# Patient Record
Sex: Female | Born: 1965 | Race: White | Hispanic: No | Smoking: Never smoker
Health system: Southern US, Community
[De-identification: ages and names within clinical notes are randomized; demographics above are authoritative.]

## PROBLEM LIST (undated history)

## (undated) HISTORY — PX: TAYLOR BUNIONECTOMY: SHX2485

## (undated) HISTORY — PX: WISDOM TOOTH EXTRACTION: SHX21

---

## 2009-12-03 ENCOUNTER — Encounter: Admission: RE | Admit: 2009-12-03 | Discharge: 2009-12-03 | Payer: Self-pay | Admitting: Family Medicine

## 2010-07-03 ENCOUNTER — Encounter: Admission: RE | Admit: 2010-07-03 | Discharge: 2010-07-03 | Payer: Self-pay | Admitting: Family Medicine

## 2011-06-03 ENCOUNTER — Other Ambulatory Visit: Payer: Self-pay | Admitting: Family Medicine

## 2011-06-03 DIAGNOSIS — Z1231 Encounter for screening mammogram for malignant neoplasm of breast: Secondary | ICD-10-CM

## 2011-07-16 ENCOUNTER — Ambulatory Visit
Admission: RE | Admit: 2011-07-16 | Discharge: 2011-07-16 | Disposition: A | Discharge status: Home / Self Care | Payer: BC Managed Care – PPO | Source: Ambulatory Visit | Attending: Family Medicine | Admitting: Family Medicine

## 2011-07-16 ENCOUNTER — Ambulatory Visit: Payer: Self-pay

## 2011-07-16 DIAGNOSIS — Z1231 Encounter for screening mammogram for malignant neoplasm of breast: Secondary | ICD-10-CM

## 2012-07-04 ENCOUNTER — Other Ambulatory Visit: Payer: Self-pay | Admitting: Family Medicine

## 2012-07-04 DIAGNOSIS — Z1231 Encounter for screening mammogram for malignant neoplasm of breast: Secondary | ICD-10-CM

## 2012-08-11 ENCOUNTER — Ambulatory Visit
Admission: RE | Admit: 2012-08-11 | Discharge: 2012-08-11 | Disposition: A | Discharge status: Home / Self Care | Payer: BC Managed Care – PPO | Source: Ambulatory Visit | Attending: Family Medicine | Admitting: Family Medicine

## 2012-08-11 DIAGNOSIS — Z1231 Encounter for screening mammogram for malignant neoplasm of breast: Secondary | ICD-10-CM

## 2012-08-15 ENCOUNTER — Ambulatory Visit: Payer: BC Managed Care – PPO

## 2013-07-06 ENCOUNTER — Other Ambulatory Visit: Payer: Self-pay

## 2013-07-06 DIAGNOSIS — Z1231 Encounter for screening mammogram for malignant neoplasm of breast: Secondary | ICD-10-CM

## 2013-09-12 ENCOUNTER — Ambulatory Visit
Admission: RE | Admit: 2013-09-12 | Discharge: 2013-09-12 | Disposition: A | Discharge status: Home / Self Care | Payer: PRIVATE HEALTH INSURANCE | Source: Ambulatory Visit

## 2013-09-12 DIAGNOSIS — Z1231 Encounter for screening mammogram for malignant neoplasm of breast: Secondary | ICD-10-CM

## 2014-06-03 ENCOUNTER — Other Ambulatory Visit: Payer: Self-pay

## 2014-06-03 DIAGNOSIS — N6452 Nipple discharge: Secondary | ICD-10-CM

## 2014-06-04 ENCOUNTER — Other Ambulatory Visit: Payer: Self-pay | Admitting: Obstetrics and Gynecology

## 2014-06-04 DIAGNOSIS — N6452 Nipple discharge: Secondary | ICD-10-CM

## 2014-06-12 ENCOUNTER — Ambulatory Visit
Admission: RE | Admit: 2014-06-12 | Discharge: 2014-06-12 | Disposition: A | Discharge status: Home / Self Care | Payer: PRIVATE HEALTH INSURANCE | Source: Ambulatory Visit | Attending: Obstetrics and Gynecology | Admitting: Obstetrics and Gynecology

## 2014-06-12 ENCOUNTER — Other Ambulatory Visit: Payer: Self-pay | Admitting: Obstetrics and Gynecology

## 2014-06-12 DIAGNOSIS — N6452 Nipple discharge: Secondary | ICD-10-CM

## 2014-07-10 ENCOUNTER — Ambulatory Visit
Admission: RE | Admit: 2014-07-10 | Discharge: 2014-07-10 | Disposition: A | Discharge status: Home / Self Care | Payer: PRIVATE HEALTH INSURANCE | Source: Ambulatory Visit | Attending: Obstetrics and Gynecology | Admitting: Obstetrics and Gynecology

## 2014-07-10 DIAGNOSIS — N6452 Nipple discharge: Secondary | ICD-10-CM

## 2014-08-26 ENCOUNTER — Other Ambulatory Visit: Payer: Self-pay

## 2014-08-26 DIAGNOSIS — Z1231 Encounter for screening mammogram for malignant neoplasm of breast: Secondary | ICD-10-CM

## 2014-09-16 ENCOUNTER — Ambulatory Visit
Admission: RE | Admit: 2014-09-16 | Discharge: 2014-09-16 | Disposition: A | Discharge status: Home / Self Care | Payer: BLUE CROSS/BLUE SHIELD | Source: Ambulatory Visit

## 2014-09-16 DIAGNOSIS — Z1231 Encounter for screening mammogram for malignant neoplasm of breast: Secondary | ICD-10-CM

## 2015-08-15 ENCOUNTER — Other Ambulatory Visit: Payer: Self-pay

## 2015-08-15 DIAGNOSIS — Z1231 Encounter for screening mammogram for malignant neoplasm of breast: Secondary | ICD-10-CM

## 2015-10-22 ENCOUNTER — Ambulatory Visit
Admission: RE | Admit: 2015-10-22 | Discharge: 2015-10-22 | Disposition: A | Discharge status: Home / Self Care | Payer: BLUE CROSS/BLUE SHIELD | Source: Ambulatory Visit

## 2015-10-22 DIAGNOSIS — Z1231 Encounter for screening mammogram for malignant neoplasm of breast: Secondary | ICD-10-CM

## 2016-08-18 ENCOUNTER — Encounter: Payer: Self-pay | Admitting: Gastroenterology

## 2016-09-24 ENCOUNTER — Other Ambulatory Visit: Payer: Self-pay | Admitting: Family Medicine

## 2016-09-24 DIAGNOSIS — Z1231 Encounter for screening mammogram for malignant neoplasm of breast: Secondary | ICD-10-CM

## 2016-10-11 ENCOUNTER — Ambulatory Visit (AMBULATORY_SURGERY_CENTER): Payer: Self-pay | Admitting: *Deleted

## 2016-10-11 VITALS — Ht 66.0 in | Wt 166.0 lb

## 2016-10-11 DIAGNOSIS — Z1211 Encounter for screening for malignant neoplasm of colon: Secondary | ICD-10-CM

## 2016-10-11 MED ORDER — NA SULFATE-K SULFATE-MG SULF 17.5-3.13-1.6 GM/177ML PO SOLN: ORAL | 0 refills | Status: DC

## 2016-10-11 NOTE — Progress Notes (Signed)
Patient denies any allergies to eggs or soy. Patient denies any problems with anesthesia/sedation. Patient denies any oxygen use at home and does not take any diet/weight loss medications. EMMI education declined by patient.  

## 2016-10-12 ENCOUNTER — Encounter: Payer: Self-pay | Admitting: Gastroenterology

## 2016-10-25 ENCOUNTER — Encounter: Payer: Self-pay | Admitting: Gastroenterology

## 2016-10-25 ENCOUNTER — Ambulatory Visit (AMBULATORY_SURGERY_CENTER): Payer: BLUE CROSS/BLUE SHIELD | Admitting: Gastroenterology

## 2016-10-25 VITALS — BP 92/64 | HR 63 | Temp 96.2°F | Resp 13 | Ht 66.0 in | Wt 166.0 lb

## 2016-10-25 DIAGNOSIS — D120 Benign neoplasm of cecum: Secondary | ICD-10-CM

## 2016-10-25 DIAGNOSIS — Z1211 Encounter for screening for malignant neoplasm of colon: Secondary | ICD-10-CM | POA: Diagnosis not present

## 2016-10-25 DIAGNOSIS — Z1212 Encounter for screening for malignant neoplasm of rectum: Secondary | ICD-10-CM | POA: Diagnosis not present

## 2016-10-25 DIAGNOSIS — D126 Benign neoplasm of colon, unspecified: Secondary | ICD-10-CM | POA: Diagnosis not present

## 2016-10-25 MED ORDER — SODIUM CHLORIDE 0.9 % IV SOLN: 500.0000 mL | INTRAVENOUS | Status: AC

## 2016-10-25 NOTE — Patient Instructions (Signed)
Impression/Recommendations:  Polyp handout given to patient.  Repeat colonoscopy in 5 - 10 years based on pathology results.  YOU HAD AN ENDOSCOPIC PROCEDURE TODAY AT [facility]:   Refer to the procedure report that was given to you for any specific questions about what was found during the examination.  If the procedure report does not answer your questions, please call your gastroenterologist to clarify.  If you requested that your care partner not be given the details of your procedure findings, then the procedure report has been included in a sealed envelope for you to review at your convenience later.  YOU SHOULD EXPECT: Some feelings of bloating in the abdomen. Passage of more gas than usual.  Walking can help get rid of the air that was put into your GI tract during the procedure and reduce the bloating. If you had a lower endoscopy (such as a colonoscopy or flexible sigmoidoscopy) you may notice spotting of blood in your stool or on the toilet paper. If you underwent a bowel prep for your procedure, you may not have a normal bowel movement for a few days.  Please Note:  You might notice some irritation and congestion in your nose or some drainage.  This is from the oxygen used during your procedure.  There is no need for concern and it should clear up in a day or so.  SYMPTOMS TO REPORT IMMEDIATELY:   Following lower endoscopy (colonoscopy or flexible sigmoidoscopy):  Excessive amounts of blood in the stool  Significant tenderness or worsening of abdominal pains  Swelling of the abdomen that is new, acute  Fever of 100F or higherFor urgent or emergent issues, a gastroenterologist can be reached at any hour by calling [phone].   DIET:  We do recommend a small meal at first, but then you may proceed to your regular diet.  Drink plenty of fluids but you should avoid alcoholic beverages for 24 hours.  ACTIVITY:  You should plan to take it easy for the rest of  today and you should NOT DRIVE or use heavy machinery until tomorrow (because of the sedation medicines used during the test).    FOLLOW UP: Our staff will call the number listed on your records the next business day following your procedure to check on you and address any questions or concerns that you may have regarding the information given to you following your procedure. If we do not reach you, we will leave a message.  However, if you are feeling well and you are not experiencing any problems, there is no need to return our call.  We will assume that you have returned to your regular daily activities without incident.  If any biopsies were taken you will be contacted by phone or by letter within the next 1-3 weeks.  Please call us at [phone] if you have not heard about the biopsies in 3 weeks.    SIGNATURES/CONFIDENTIALITY: You and/or your care partner have signed paperwork which will be entered into your electronic medical record.  These signatures attest to the fact that that the information above on your After Visit Summary has been reviewed and is understood.  Full responsibility of the confidentiality of this discharge information lies with you and/or your care-partner.

## 2016-10-25 NOTE — Progress Notes (Signed)
Called to room to assist during endoscopic procedure.  Patient ID and intended procedure confirmed with present staff. Received instructions for my participation in the procedure from the performing physician.  

## 2016-10-25 NOTE — Progress Notes (Signed)
Report to PACU, RN, vss, BBS= Clear.  

## 2016-10-25 NOTE — Op Note (Signed)
[name] Patient Name: Katie Carroll Procedure Date: 10/25/2016 2:12 PM MRN: [id number] Endoscopist: Ladene Artist , MD Age: 51 Referring MD:  Date of Birth: [birth year] Gender: Female Account #: [id number] Procedure:                Colonoscopy Indications:              Screening for colorectal malignant neoplasm Medicines:                Monitored Anesthesia Care Procedure:                Pre-Anesthesia Assessment:                           - Prior to the procedure, a History and Physical                            was performed, and patient medications and                            allergies were reviewed. The patient's tolerance of                            previous anesthesia was also reviewed. The risks                            and benefits of the procedure and the sedation                            options and risks were discussed with the patient.                            All questions were answered, and informed consent                            was obtained. Prior Anticoagulants: The patient has                            taken no previous anticoagulant or antiplatelet                            agents. ASA Grade Assessment: II - A patient with                            mild systemic disease. After reviewing the risks                            and benefits, the patient was deemed in                            satisfactory condition to undergo the procedure.                           After obtaining informed consent, the colonoscope  was passed under direct vision. Throughout the                            procedure, the patient's blood pressure, pulse, and                            oxygen saturations were monitored continuously. The                            Model PCF-H190L [id number]) scope was introduced                            through the anus and advanced to the the cecum,                            identified  by appendiceal orifice and ileocecal                            valve. The ileocecal valve, appendiceal orifice,                            and rectum were photographed. The quality of the                            bowel preparation was good. The colonoscopy was                            performed without difficulty. The patient tolerated                            the procedure well. Scope In: 2:22:25 PM Scope Out: 2:35:21 PM Scope Withdrawal Time: 0 hours 10 minutes 1 second  Total Procedure Duration: 0 hours 12 minutes 56 seconds  Findings:                 The perianal and digital rectal examinations were                            normal.                           A 7 mm polyp was found in the cecum. The polyp was                            sessile. The polyp was removed with a cold snare.                            Resection and retrieval were complete.                           The exam was otherwise without abnormality on                            direct and retroflexion views. Complications:  No immediate complications. Estimated blood loss:                            None. Estimated Blood Loss:     Estimated blood loss: none. Impression:               - One 7 mm polyp in the cecum, removed with a cold                            snare. Resected and retrieved.                           - The examination was otherwise normal on direct                            and retroflexion views. Recommendation:           - Repeat colonoscopy in 5 years for surveillance if                            polyp is precancerous, otherwise 10 years for                            screening.                           - Patient has a contact number available for                            emergencies. The signs and symptoms of potential                            delayed complications were discussed with the                            patient. Return to normal activities tomorrow.                             Written discharge instructions were provided to the                            patient.                           - Resume previous diet.                           - Continue present medications.                           - Await pathology results. Ladene Artist, MD 10/25/2016 2:38:03 PM This report has been signed electronically.

## 2016-10-26 ENCOUNTER — Telehealth: Payer: Self-pay

## 2016-10-26 NOTE — Telephone Encounter (Signed)
  Follow up Call-  Call back number 10/25/2016  Post procedure Call Back phone  # [phone]  Permission to leave phone message Yes  Some recent data might be hidden     Patient questions:  Do you have a fever, pain , or abdominal swelling? No. Pain Score  0 *  Have you tolerated food without any problems? Yes.    Have you been able to return to your normal activities? Yes.    Do you have any questions about your discharge instructions: Diet   No. Medications  No. Follow up visit  No.  Do you have questions or concerns about your Care? No.  Actions: * If pain score is 4 or above: No action needed, pain <4.

## 2016-11-10 ENCOUNTER — Ambulatory Visit
Admission: RE | Admit: 2016-11-10 | Discharge: 2016-11-10 | Disposition: A | Discharge status: Home / Self Care | Payer: BLUE CROSS/BLUE SHIELD | Source: Ambulatory Visit | Attending: Family Medicine | Admitting: Family Medicine

## 2016-11-10 DIAGNOSIS — Z1231 Encounter for screening mammogram for malignant neoplasm of breast: Secondary | ICD-10-CM

## 2016-11-11 ENCOUNTER — Other Ambulatory Visit: Payer: Self-pay | Admitting: Family Medicine

## 2016-11-11 ENCOUNTER — Encounter: Payer: Self-pay | Admitting: Gastroenterology

## 2016-11-11 DIAGNOSIS — R928 Other abnormal and inconclusive findings on diagnostic imaging of breast: Secondary | ICD-10-CM

## 2016-11-15 ENCOUNTER — Encounter: Payer: Self-pay | Admitting: Family Medicine

## 2016-11-15 ENCOUNTER — Ambulatory Visit
Admission: RE | Admit: 2016-11-15 | Discharge: 2016-11-15 | Disposition: A | Discharge status: Home / Self Care | Payer: BLUE CROSS/BLUE SHIELD | Source: Ambulatory Visit | Attending: Family Medicine | Admitting: Family Medicine

## 2016-11-15 ENCOUNTER — Telehealth: Payer: Self-pay | Admitting: Gastroenterology

## 2016-11-15 DIAGNOSIS — R928 Other abnormal and inconclusive findings on diagnostic imaging of breast: Secondary | ICD-10-CM

## 2016-11-15 NOTE — Telephone Encounter (Signed)
Patient notified of the letter from last week and the details.  All questions answered. She will call back for any additional questions

## 2017-09-08 IMAGING — MG 2D DIGITAL DIAGNOSTIC UNILATERAL LEFT MAMMOGRAM WITH CAD AND ADJ
6 series · 6 of 14 positions shown · non-contrast
Comparison: October 22, 2015

CLINICAL DATA: Possible mass left breast identified on recent
screening mammogram.

EXAM:
2D DIGITAL DIAGNOSTIC LEFT MAMMOGRAM WITH ADJUNCT TOMO
ULTRASOUND LEFT BREAST

[L CC]
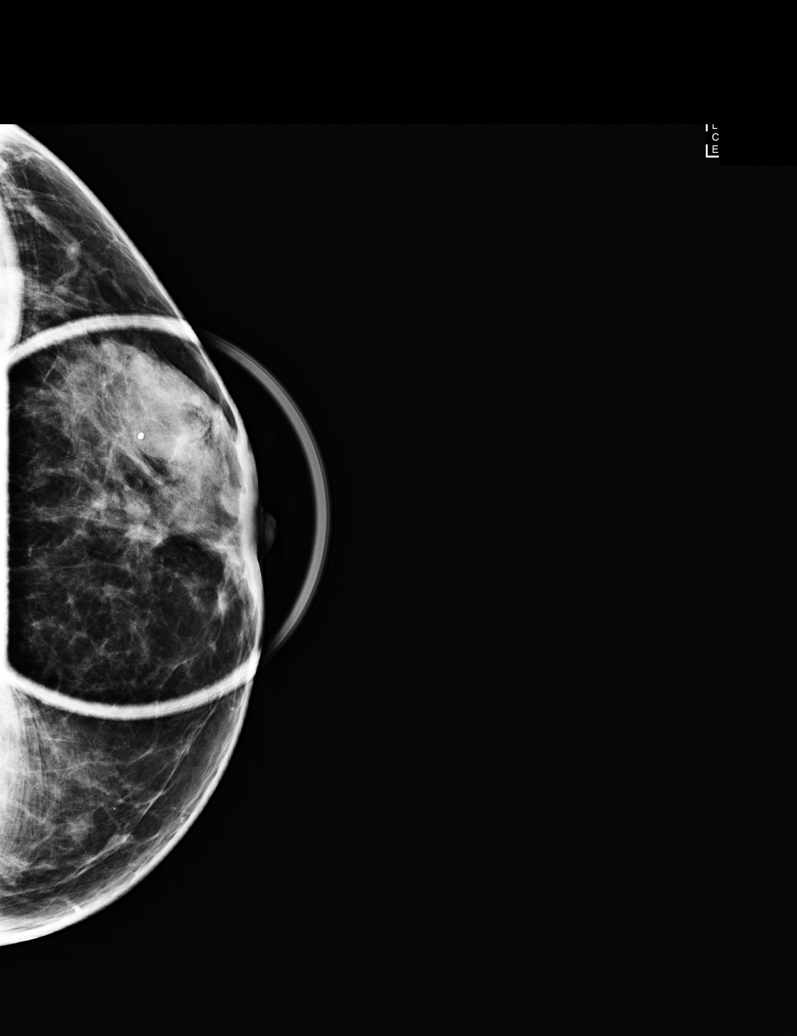

[L CC synth-2D]
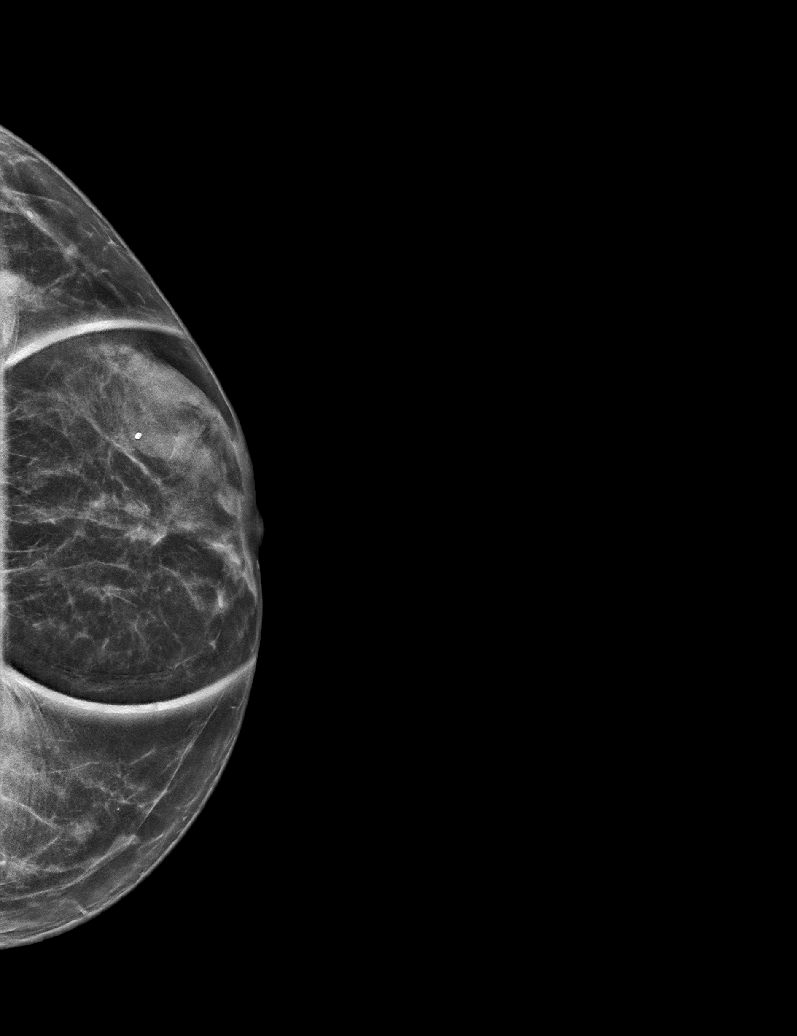

[L MLO]
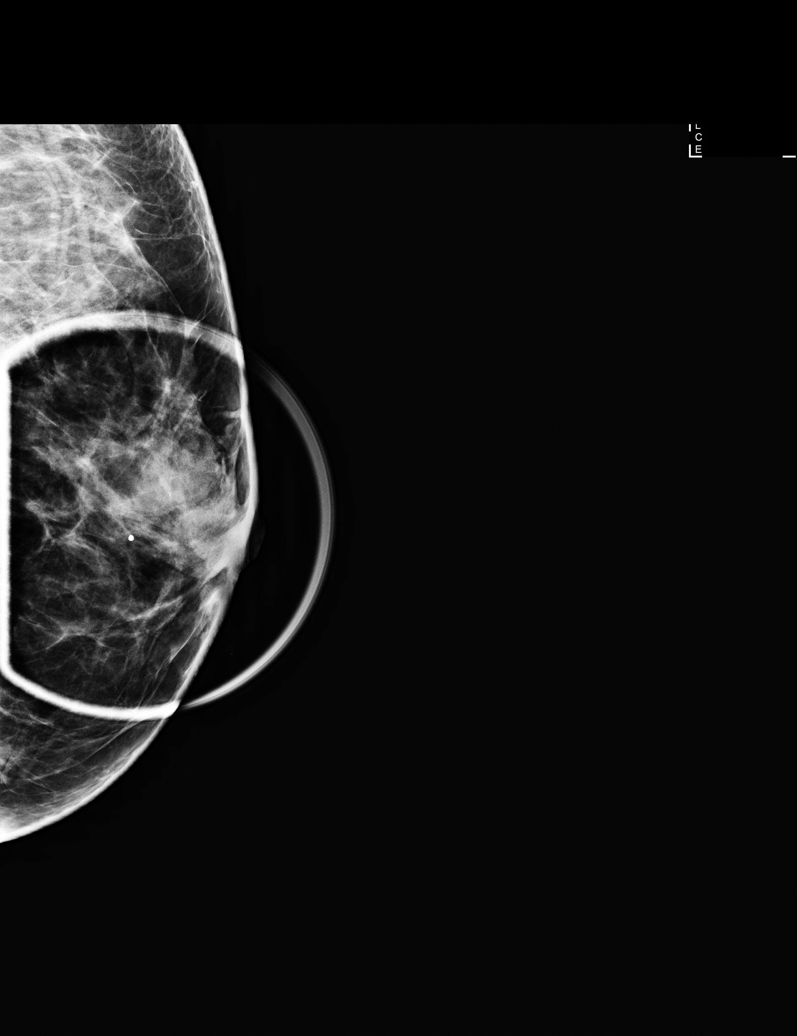

[L MLO synth-2D]
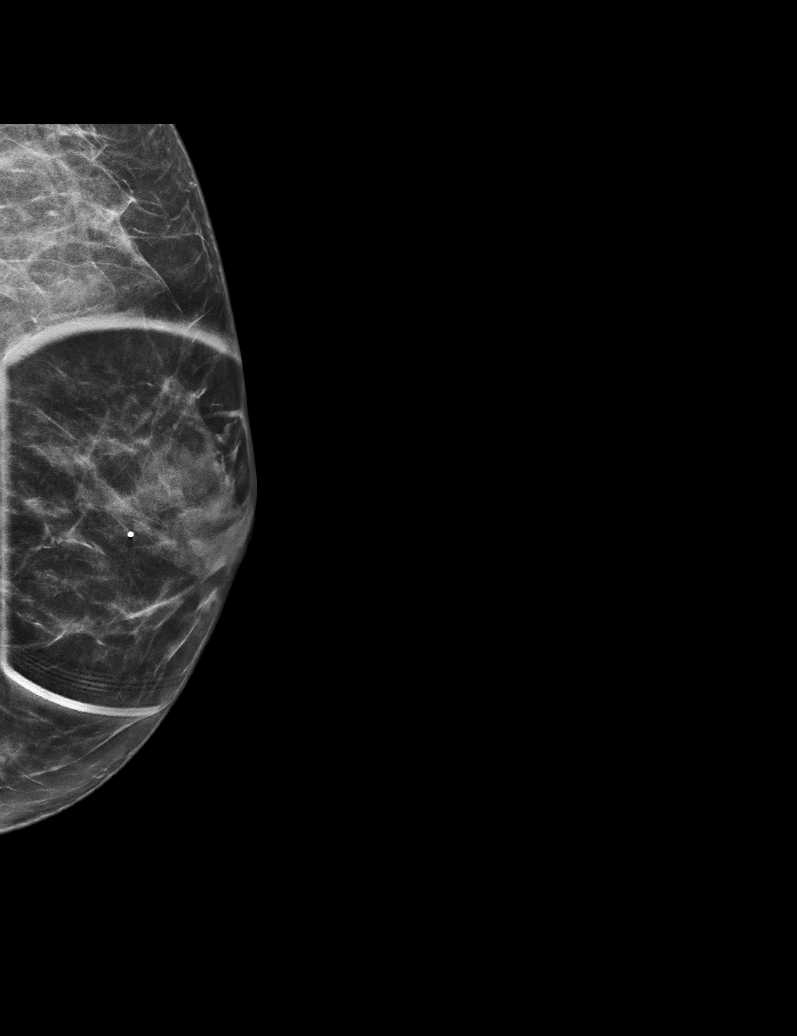

[L CC tomo · tomo slice 25/48.0]
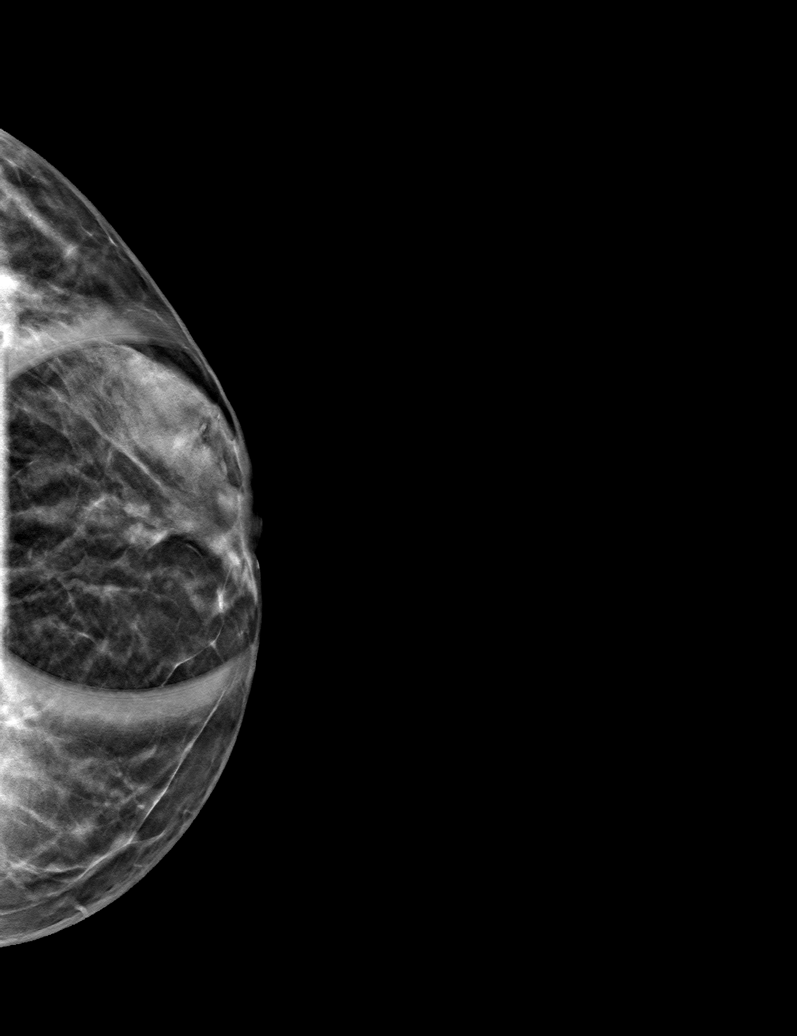

[L MLO tomo · tomo slice 27/54.0]
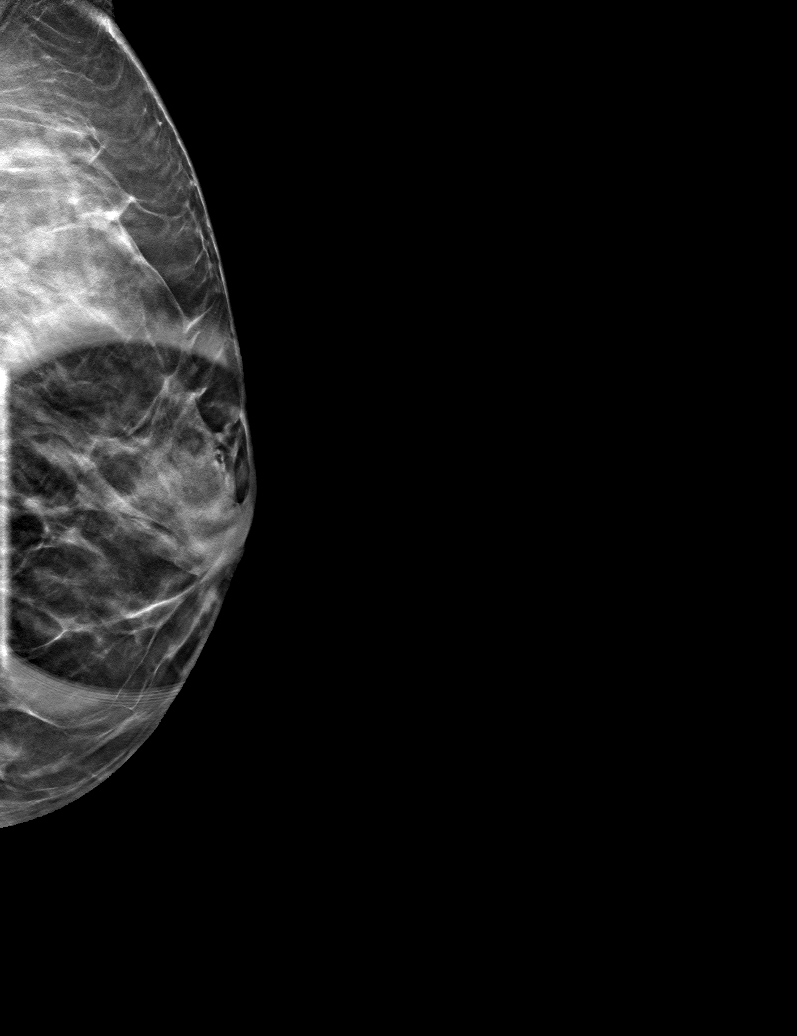

[6 of 14 positions shown; findings below may reference images not displayed]

ACR Breast Density Category c: The breast tissue is heterogeneously
dense, which may obscure small masses.
FINDINGS: Focal spot compression views of the retroareolar left breast confirm
a low-density circumscribed oval mass in the outer periareolar left
breast.

On physical exam, a smooth approximately 2.0 cm mass is palpated in
the 3 o'clock position left breast 2 cm from the nipple.

Targeted ultrasound is performed, showing an oval 1.8 x 1.4 x 1.2 cm
oval simple cyst is imaged at 3 o'clock position 2 cm from the
nipple. This accounts for the mass seen on recent screening
mammogram. No suspicious masses seen.
IMPRESSION: 1.8 cm simple cyst 3 o'clock position left breast. No evidence of
malignancy.

RECOMMENDATION:
Screening mammogram in one year.(Code:VG-5-LDH)

I have discussed the findings and recommendations with the patient.
Results were also provided in writing at the conclusion of the
visit. If applicable, a reminder letter will be sent to the patient
regarding the next appointment.

BI-RADS CATEGORY  2: Benign.

## 2017-10-10 ENCOUNTER — Other Ambulatory Visit: Payer: Self-pay | Admitting: Family Medicine

## 2017-10-10 DIAGNOSIS — Z1231 Encounter for screening mammogram for malignant neoplasm of breast: Secondary | ICD-10-CM

## 2017-11-14 ENCOUNTER — Ambulatory Visit
Admission: RE | Admit: 2017-11-14 | Discharge: 2017-11-14 | Disposition: A | Discharge status: Home / Self Care | Payer: PRIVATE HEALTH INSURANCE | Source: Ambulatory Visit | Attending: Family Medicine | Admitting: Family Medicine

## 2017-11-14 DIAGNOSIS — Z1231 Encounter for screening mammogram for malignant neoplasm of breast: Secondary | ICD-10-CM

## 2018-03-17 ENCOUNTER — Encounter: Payer: Self-pay | Admitting: Family Medicine

## 2018-03-17 ENCOUNTER — Ambulatory Visit (INDEPENDENT_AMBULATORY_CARE_PROVIDER_SITE_OTHER): Payer: PRIVATE HEALTH INSURANCE | Admitting: Family Medicine

## 2018-03-17 VITALS — BP 104/66 | HR 56 | Ht 66.0 in | Wt 145.0 lb

## 2018-03-17 DIAGNOSIS — G5702 Lesion of sciatic nerve, left lower limb: Secondary | ICD-10-CM

## 2018-03-17 DIAGNOSIS — M25552 Pain in left hip: Secondary | ICD-10-CM | POA: Diagnosis not present

## 2018-03-17 NOTE — Assessment & Plan Note (Signed)
2/2 piriformis syndrome.  Shown home exercises and stretches to start - she's going to do physical therapy.  Tylenol aleve if needed.  Massage.  Run on flat surfaces.  F/u in 6 weeks.

## 2018-03-17 NOTE — Patient Instructions (Signed)
You have piriformis syndrome. Try to avoid painful activities when possible. Pick 2-3 stretches where you feel the pull in the area of pain - do 3 of these and hold for 20-30 seconds twice a day. Tylenol, aleve only if needed. Lacrosse or tennis ball to massage area when sitting if needed. Start physical therapy at [facility] in [place] and do home exercises/stretches on days you don't go to therapy. Run on flat surfaces (treadmill, track) as you have been. Follow up with me in 6 weeks.

## 2018-03-17 NOTE — Progress Notes (Signed)
PCP: Yvone Neu, MD  Subjective:   HPI: Patient is a 52 y.o. female here for left hip pain.  Patient reports she's had about 8 months of posterior left hip pain. No acute injury or trauma. Pain is 2/10 but up to 5/10 with running, sharp. Can radiate down into her leg but no numbness, skin changes. Starts with running and bothers the whole time. Has tried some stretching which helps.  No past medical history on file.  Current Outpatient Medications on File Prior to Visit  Medication Sig Dispense Refill  . Ascorbic Acid (VITAMIN C) 1000 MG tablet Take 1,000 mg by mouth daily.    . Cholecalciferol (VITAMIN D PO) Take 5,000 Units by mouth daily.    . Lactobacillus (ACIDOPHOLUS PO) Take 1 capsule by mouth daily.    . mometasone (NASONEX) 50 MCG/ACT nasal spray Place 2 sprays into the nose daily.     Current Facility-Administered Medications on File Prior to Visit  Medication Dose Route Frequency Provider Last Rate Last Dose  . 0.9 %  sodium chloride infusion  500 mL Intravenous Continuous Ladene Artist, MD        Past Surgical History:  Procedure Laterality Date  . TAYLOR BUNIONECTOMY Bilateral   . WISDOM TOOTH EXTRACTION      Allergies  Allergen Reactions  . Sulfur Other (See Comments)    Inflammation   . Niacin And Related     Skin redness and "spacey"  . Proanthocyanidin Other (See Comments)    Red blisters  . Grape Seed Extract [Calcium Phosphate] Rash    Social History   Socioeconomic History  . Marital status: Legally Separated    Spouse name: Not on file  . Number of children: Not on file  . Years of education: Not on file  . Highest education level: Not on file  Occupational History  . Not on file  Social Needs  . Financial resource strain: Not on file  . Food insecurity:    Worry: Not on file    Inability: Not on file  . Transportation needs:    Medical: Not on file    Non-medical: Not on file  Tobacco Use  . Smoking status: Never  Smoker  . Smokeless tobacco: Never Used  Substance and Sexual Activity  . Alcohol use: Yes    Comment: occasional- monthly maybe  . Drug use: No  . Sexual activity: Not on file  Lifestyle  . Physical activity:    Days per week: Not on file    Minutes per session: Not on file  . Stress: Not on file  Relationships  . Social connections:    Talks on phone: Not on file    Gets together: Not on file    Attends religious service: Not on file    Active member of club or organization: Not on file    Attends meetings of clubs or organizations: Not on file    Relationship status: Not on file  . Intimate partner violence:    Fear of current or ex partner: Not on file    Emotionally abused: Not on file    Physically abused: Not on file    Forced sexual activity: Not on file  Other Topics Concern  . Not on file  Social History Narrative  . Not on file    Family History  Problem Relation Age of Onset  . Uterine cancer Mother   . Diabetes Father   . Colon cancer Neg Hx   .  Breast cancer Neg Hx     BP 104/66   Pulse (!) 56   Ht 5\' 6"  (1.676 m)   Wt 145 lb (65.8 kg)   BMI 23.40 kg/m   Review of Systems: See HPI above.     Objective:  Physical Exam:  Gen: NAD, comfortable in exam room  Back: No gross deformity, scoliosis. No TTP .  No midline or bony TTP. FROM. Strength LEs 5/5 all muscle groups.   2+ MSRs in patellar and achilles tendons, equal bilaterally. Negative SLRs. Sensation intact to light touch bilaterally.  Left hip: No deformity. FROM with 5/5 strength including hip abduction. Tenderness to palpation over piriformis, external rotators. NVI distally. Negative logroll bilateral hips Negative fabers, positive piriformis.   Assessment & Plan:  1. Left hip pain - 2/2 piriformis syndrome.  Shown home exercises and stretches to start - she's going to do physical therapy.  Tylenol aleve if needed.  Massage.  Run on flat surfaces.  F/u in 6 weeks.

## 2018-04-28 ENCOUNTER — Encounter: Payer: Self-pay | Admitting: Family Medicine

## 2018-04-28 ENCOUNTER — Ambulatory Visit (INDEPENDENT_AMBULATORY_CARE_PROVIDER_SITE_OTHER): Payer: PRIVATE HEALTH INSURANCE | Admitting: Family Medicine

## 2018-04-28 VITALS — BP 94/67 | HR 55 | Ht 66.0 in | Wt 147.0 lb

## 2018-04-28 DIAGNOSIS — G5702 Lesion of sciatic nerve, left lower limb: Secondary | ICD-10-CM

## 2018-04-28 NOTE — Progress Notes (Signed)
PCP: Yvone Neu, MD  Subjective:   HPI: Patient is a 52 y.o. female here for left hip pain.  7/19: Patient reports she's had about 8 months of posterior left hip pain. No acute injury or trauma. Pain is 2/10 but up to 5/10 with running, sharp. Can radiate down into her leg but no numbness, skin changes. Starts with running and bothers the whole time. Has tried some stretching which helps.  8/30: Pt here for followup for left piriformis pain. She reports 2/10 pain with running. She runs 3-5 miles several times per week. Pain starts about 1 mile into her run. She also feels mild discomfort with sitting. She reports good improvement overall with PT where they have been doing dry needling, cupping and heat. She denies any radiating pain. No numbness or tingling.   No past medical history on file.  Current Outpatient Medications on File Prior to Visit  Medication Sig Dispense Refill  . Ascorbic Acid (VITAMIN C) 1000 MG tablet Take 1,000 mg by mouth daily.    . Cholecalciferol (VITAMIN D PO) Take 5,000 Units by mouth daily.    . Lactobacillus (ACIDOPHOLUS PO) Take 1 capsule by mouth daily.    . mometasone (NASONEX) 50 MCG/ACT nasal spray Place 2 sprays into the nose daily.     Current Facility-Administered Medications on File Prior to Visit  Medication Dose Route Frequency Provider Last Rate Last Dose  . 0.9 %  sodium chloride infusion  500 mL Intravenous Continuous Ladene Artist, MD        Past Surgical History:  Procedure Laterality Date  . TAYLOR BUNIONECTOMY Bilateral   . WISDOM TOOTH EXTRACTION      Allergies  Allergen Reactions  . Sulfur Other (See Comments)    Inflammation   . Niacin And Related     Skin redness and "spacey"  . Proanthocyanidin Other (See Comments)    Red blisters  . Grape Seed Extract [Calcium Phosphate] Rash    Social History   Socioeconomic History  . Marital status: Legally Separated    Spouse name: Not on file  . Number of  children: Not on file  . Years of education: Not on file  . Highest education level: Not on file  Occupational History  . Not on file  Social Needs  . Financial resource strain: Not on file  . Food insecurity:    Worry: Not on file    Inability: Not on file  . Transportation needs:    Medical: Not on file    Non-medical: Not on file  Tobacco Use  . Smoking status: Never Smoker  . Smokeless tobacco: Never Used  Substance and Sexual Activity  . Alcohol use: Yes    Comment: occasional- monthly maybe  . Drug use: No  . Sexual activity: Not on file  Lifestyle  . Physical activity:    Days per week: Not on file    Minutes per session: Not on file  . Stress: Not on file  Relationships  . Social connections:    Talks on phone: Not on file    Gets together: Not on file    Attends religious service: Not on file    Active member of club or organization: Not on file    Attends meetings of clubs or organizations: Not on file    Relationship status: Not on file  . Intimate partner violence:    Fear of current or ex partner: Not on file    Emotionally abused:  Not on file    Physically abused: Not on file    Forced sexual activity: Not on file  Other Topics Concern  . Not on file  Social History Narrative  . Not on file    Family History  Problem Relation Age of Onset  . Uterine cancer Mother   . Diabetes Father   . Colon cancer Neg Hx   . Breast cancer Neg Hx     BP 94/67   Pulse (!) 55   Ht 5\' 6"  (1.676 m)   Wt 147 lb (66.7 kg)   BMI 23.73 kg/m   Review of Systems: See HPI above.     Objective:  Physical Exam:  GEN: AAOx4, NAD Pulm: breathing unlabored  Left Hip:  - Inspection: No gross deformity, no swelling, erythema, or ecchymosis - Palpation: TTP over the piriformis, No TTP over greater trochanter - ROM: Normal range of motion on Flexion, extension, abduction, adduction, internal and external rotation - Strength: Normal strength at hip and knee -  Special Tests: pain over the piriformis with FABER. no pain with FADIR. Neg log roll. Neg SLR  Right hip: No TTP over greater trochanter Full ROM Neg FABER and FADIR Neg SLR   Assessment & Plan:  1. Left hip pain - 2/2 piriformis syndrome.  Patient is doing well and improving Continue with physical therapy and home exercises Follow-up in 6 weeks or as needed

## 2018-04-28 NOTE — Patient Instructions (Signed)
You're doing great! Continue physical therapy and dry needling, transition to home exercises. After you're done and pain is resolved make sure you do home exercises for 4-6 more weeks then discontinue. Follow up with me in 6 weeks or as needed.

## 2018-04-29 ENCOUNTER — Encounter: Payer: Self-pay | Admitting: Family Medicine

## 2018-10-13 ENCOUNTER — Other Ambulatory Visit: Payer: Self-pay | Admitting: Family Medicine

## 2018-10-13 DIAGNOSIS — Z1231 Encounter for screening mammogram for malignant neoplasm of breast: Secondary | ICD-10-CM

## 2018-11-17 ENCOUNTER — Inpatient Hospital Stay: Admission: RE | Admit: 2018-11-17 | Payer: PRIVATE HEALTH INSURANCE | Source: Ambulatory Visit

## 2018-12-25 ENCOUNTER — Ambulatory Visit: Payer: PRIVATE HEALTH INSURANCE

## 2019-02-13 ENCOUNTER — Ambulatory Visit: Payer: PRIVATE HEALTH INSURANCE

## 2019-03-23 ENCOUNTER — Ambulatory Visit: Payer: PRIVATE HEALTH INSURANCE

## 2019-04-23 ENCOUNTER — Ambulatory Visit
Admission: RE | Admit: 2019-04-23 | Discharge: 2019-04-23 | Disposition: A | Discharge status: Home / Self Care | Payer: PRIVATE HEALTH INSURANCE | Source: Ambulatory Visit | Attending: Family Medicine | Admitting: Family Medicine

## 2019-04-23 ENCOUNTER — Other Ambulatory Visit: Payer: Self-pay

## 2019-04-23 DIAGNOSIS — Z1231 Encounter for screening mammogram for malignant neoplasm of breast: Secondary | ICD-10-CM
